# Patient Record
Sex: Male | Born: 1955 | Race: White | Hispanic: No | Smoking: Never smoker
Health system: Southern US, Community
[De-identification: ages and names within clinical notes are randomized; demographics above are authoritative.]

## PROBLEM LIST (undated history)

## (undated) DIAGNOSIS — I10 Essential (primary) hypertension: Secondary | ICD-10-CM

## (undated) DIAGNOSIS — F419 Anxiety disorder, unspecified: Secondary | ICD-10-CM

## (undated) DIAGNOSIS — G4700 Insomnia, unspecified: Secondary | ICD-10-CM

## (undated) DIAGNOSIS — N529 Male erectile dysfunction, unspecified: Secondary | ICD-10-CM

## (undated) DIAGNOSIS — E785 Hyperlipidemia, unspecified: Secondary | ICD-10-CM

## (undated) HISTORY — PX: OTHER SURGICAL HISTORY: SHX169

## (undated) HISTORY — PX: SHOULDER SURGERY: SHX246

---

## 2015-01-25 ENCOUNTER — Emergency Department
Admission: EM | Admit: 2015-01-25 | Discharge: 2015-01-25 | Disposition: A | Discharge status: Home / Self Care | Payer: PRIVATE HEALTH INSURANCE | Source: Home / Self Care | Attending: Family Medicine | Admitting: Family Medicine

## 2015-01-25 ENCOUNTER — Encounter: Payer: Self-pay | Admitting: Emergency Medicine

## 2015-01-25 DIAGNOSIS — R509 Fever, unspecified: Secondary | ICD-10-CM | POA: Diagnosis not present

## 2015-01-25 DIAGNOSIS — R519 Headache, unspecified: Secondary | ICD-10-CM

## 2015-01-25 DIAGNOSIS — R51 Headache: Secondary | ICD-10-CM | POA: Diagnosis not present

## 2015-01-25 HISTORY — DX: Hyperlipidemia, unspecified: E78.5

## 2015-01-25 HISTORY — DX: Essential (primary) hypertension: I10

## 2015-01-25 HISTORY — DX: Insomnia, unspecified: G47.00

## 2015-01-25 HISTORY — DX: Male erectile dysfunction, unspecified: N52.9

## 2015-01-25 HISTORY — DX: Anxiety disorder, unspecified: F41.9

## 2015-01-25 LAB — POCT CBC W AUTO DIFF (K'VILLE URGENT CARE)

## 2015-01-25 LAB — POCT URINALYSIS DIP (MANUAL ENTRY)
Bilirubin, UA: NEGATIVE
Glucose, UA: NEGATIVE
Leukocytes, UA: NEGATIVE
Nitrite, UA: NEGATIVE
SPEC GRAV UA: 1.025 (ref 1.005–1.03)
UROBILINOGEN UA: 0.2 (ref 0–1)
pH, UA: 6 (ref 5–8)

## 2015-01-25 LAB — POCT INFLUENZA A/B
INFLUENZA A, POC: NEGATIVE
Influenza B, POC: NEGATIVE

## 2015-01-25 MED ORDER — BUTALBITAL-APAP-CAFFEINE 50-325-40 MG PO TABS: 1.0000 | ORAL_TABLET | ORAL | Status: DC | PRN

## 2015-01-25 NOTE — Discharge Instructions (Signed)
Rest.  Increase fluid intake.  Check temperature daily. ° °If symptoms become significantly worse during the night or over the weekend, proceed to the local emergency room.  °

## 2015-01-25 NOTE — ED Notes (Signed)
Reports onset of fever and aches last night; headache severe; temp this a.m. 102.9; took ibuprofen twice this morning.

## 2015-01-25 NOTE — ED Provider Notes (Signed)
CSN: [id number]     Arrival date & time 01/25/15  1053 History   First MD Initiated Contact with Patient 01/25/15 1113     Chief Complaint  Patient presents with  . Fever  . Generalized Body Aches      HPI Comments: Last night patient developed fatigue, headache, myalgias, chills, and fever.  This morning he had a fever of 102.9.  No respiratory, GI, or GU symptoms. He states that two months ago he developed a pressure sensation in his anterior chest and was evaluated at [facility] ER.  He states that his evaluation was negative except for an abnormal EKG described as having "irregularly irregular rhythm."  He denies chest pain or tightness at present.  The history is provided by the patient.    Past Medical History  Diagnosis Date  . Hypertension   . Hyperlipidemia anxiety  . Anxiety   . ED (erectile dysfunction)   . Insomnia    Past Surgical History  Procedure Laterality Date  . Shoulder surgery    . Renal calculi     History reviewed. No pertinent family history. History  Substance Use Topics  . Smoking status: Current Every Day Smoker  . Smokeless tobacco: Not on file  . Alcohol Use: No    Review of Systems No sore throat No cough No pleuritic pain No wheezing No nasal congestion No post-nasal drainage No sinus pain/pressure No itchy/red eyes No earache No hemoptysis No SOB + fever, + chills No nausea No vomiting No abdominal pain No diarrhea No urinary symptoms No skin rash + fatigue + myalgias + headache Used OTC meds without relief  Allergies  Penicillins  Home Medications   Prior to Admission medications   Medication Sig Start Date End Date Taking? Authorizing Provider  ALPRAZolam Prudy Feeler(XANAX) 0.25 MG tablet Take 0.25 mg by mouth at bedtime as needed for anxiety.   Yes Historical Provider, MD  amLODipine (NORVASC) 5 MG tablet Take 5 mg by mouth daily.   Yes Historical Provider, MD  atorvastatin (LIPITOR) 80 MG tablet Take 80 mg by mouth  daily.   Yes Historical Provider, MD  lamoTRIgine (LAMICTAL) 100 MG tablet Take 100 mg by mouth daily.   Yes Historical Provider, MD  lisinopril (PRINIVIL,ZESTRIL) 10 MG tablet Take 10 mg by mouth daily.   Yes Historical Provider, MD  OLANZapine (ZYPREXA) 5 MG tablet Take 5 mg by mouth at bedtime.   Yes Historical Provider, MD  sildenafil (VIAGRA) 100 MG tablet Take 100 mg by mouth daily as needed for erectile dysfunction.   Yes Historical Provider, MD  tapentadol (NUCYNTA) 50 MG TABS tablet Take 75 mg by mouth.   Yes Historical Provider, MD  zolpidem (AMBIEN) 10 MG tablet Take 10 mg by mouth at bedtime as needed for sleep.   Yes Historical Provider, MD  butalbital-acetaminophen-caffeine (FIORICET, ESGIC) 50-325-40 MG per tablet Take 1 tablet by mouth every 4 (four) hours as needed for headache. (Max of 6 tabs/day) 01/25/15   Lattie HawStephen A Asako Saliba, MD   BP 118/66 mmHg  Pulse 105  Temp(Src) 101.6 F (38.7 C) (Oral)  Resp 18  Ht 5\' 8"  (1.727 m)  Wt 174 lb (78.926 kg)  BMI 26.46 kg/m2  SpO2 97% Physical Exam Nursing notes and Vital Signs reviewed. Appearance:  Patient appears stated age, and in no acute distress Eyes:  Pupils are equal, round, and reactive to light and accomodation.  Extraocular movement is intact.  Conjunctivae are not inflamed  Ears:  Canals  normal.  Tympanic membranes normal.  Nose:  Normal turbinates.  No sinus tenderness.   Pharynx:  Normal Neck:  Supple.  Tender enlarged posterior nodes are palpated bilaterally  Lungs:  Clear to auscultation.  Breath sounds are equal.  Heart:   Irregularly irregular rhythm without murmurs, rubs, or gallops.  Pulse 84 Abdomen:  Nontender without masses or hepatosplenomegaly.  Bowel sounds are present.  No CVA or flank tenderness.  Extremities:  No edema.  No calf tenderness Skin:  No rash present.   ED Course  Procedures  None   Labs Reviewed  POCT CBC W AUTO DIFF ([facility]):  WBC 8.9; LY 11.9; MO 3.4; GR 84.7; Hgb 14.2;  Platelets 205   POCT URINALYSIS DIP (MANUAL ENTRY):  KET trace; BLO small; PRO /dL; otherwise negative  POCT INFLUENZA A/B negative      MDM   1. Fever and chills ?etiology   2. Headache, unspecified headache type    Rx for Fioricet for headache Rest.  Increase fluid intake.  Check temperature daily. If symptoms become significantly worse during the night or over the weekend, proceed to the local emergency room.  Followup with Family Doctor if not improved in about 4 days. Note irregularly irregular heart rhythm on exam; recommend follow-up with PCP.    Lattie Haw, MD 01/28/15 [phone]

## 2017-04-30 ENCOUNTER — Emergency Department
Admission: EM | Admit: 2017-04-30 | Discharge: 2017-04-30 | Disposition: A | Discharge status: Home / Self Care | Payer: Federal, State, Local not specified - PPO | Source: Home / Self Care | Attending: Family Medicine | Admitting: Family Medicine

## 2017-04-30 ENCOUNTER — Encounter: Payer: Self-pay | Admitting: Emergency Medicine

## 2017-04-30 ENCOUNTER — Emergency Department (INDEPENDENT_AMBULATORY_CARE_PROVIDER_SITE_OTHER): Payer: Federal, State, Local not specified - PPO

## 2017-04-30 DIAGNOSIS — M79674 Pain in right toe(s): Secondary | ICD-10-CM | POA: Diagnosis not present

## 2017-04-30 DIAGNOSIS — S92504A Nondisplaced unspecified fracture of right lesser toe(s), initial encounter for closed fracture: Secondary | ICD-10-CM

## 2017-04-30 DIAGNOSIS — S90121A Contusion of right lesser toe(s) without damage to nail, initial encounter: Secondary | ICD-10-CM | POA: Diagnosis not present

## 2017-04-30 MED ORDER — LIDOCAINE 5 % EX PTCH: MEDICATED_PATCH | CUTANEOUS | 0 refills | Status: AC

## 2017-04-30 NOTE — ED Triage Notes (Signed)
Patient states that he dropped a trash can full of dirt on his right 3 toes last night.  He has neuropathy in his feet, able to move his toes, took a Nucynta at 9am this morning.

## 2017-04-30 NOTE — Discharge Instructions (Signed)
Wear comfortable well-fitting shoes. 

## 2017-04-30 NOTE — ED Provider Notes (Signed)
[facility]    CSN: [id number] Arrival date & time: 04/30/17  0929     History   Chief Complaint Chief Complaint  Patient presents with  . Toe Pain    HPI Jared Moore is a 61 y.o. male.   Patient reports that he dropped a trash can full of dirt on the distal aspect of his right foot yesterday.  He has peripheral neuropathy but notes mild pain with walking, and a "shooting" pain in his right third toe at night that awakens him.  He presently takes Nucynta for pain   The history is provided by the patient.  Toe Pain  This is a new problem. The current episode started yesterday. The problem has not changed since onset.The symptoms are aggravated by walking. Nothing relieves the symptoms. He has tried nothing for the symptoms.    Past Medical History:  Diagnosis Date  . Anxiety   . ED (erectile dysfunction)   . Hyperlipidemia anxiety  . Hypertension   . Insomnia     There are no active problems to display for this patient.   Past Surgical History:  Procedure Laterality Date  . renal calculi    . SHOULDER SURGERY         Home Medications    Prior to Admission medications   Medication Sig Start Date End Date Taking? Authorizing Provider  aspirin 81 MG chewable tablet Chew by mouth daily.   Yes [provider]  metoprolol succinate (TOPROL-XL) 50 MG 24 hr tablet Take 50 mg by mouth daily. Take with or immediately following a meal.   Yes [provider]  ALPRAZolam (XANAX) 0.25 MG tablet Take 0.25 mg by mouth at bedtime as needed for anxiety.    [provider]  amLODipine (NORVASC) 5 MG tablet Take 5 mg by mouth daily.    [provider]  atorvastatin (LIPITOR) 80 MG tablet Take 80 mg by mouth daily.    [provider]  lamoTRIgine (LAMICTAL) 100 MG tablet Take 100 mg by mouth daily.    [provider]  lidocaine (LIDODERM) 5 % Place partial patch (cut to fit) on affected toe and top of foot at  bedtime.  Remove & Discard patch within 12 hours 04/30/17 05/31/17  Lattie Haw, MD  OLANZapine (ZYPREXA) 5 MG tablet Take 5 mg by mouth at bedtime.    [provider]  sildenafil (VIAGRA) 100 MG tablet Take 100 mg by mouth daily as needed for erectile dysfunction.    [provider]  tapentadol (NUCYNTA) 50 MG TABS tablet Take 75 mg by mouth.    [provider]  zolpidem (AMBIEN) 10 MG tablet Take 10 mg by mouth at bedtime as needed for sleep.    [provider]    Family History No family history on file.  Social History Social History  Substance Use Topics  . Smoking status: Current Every Day Smoker  . Smokeless tobacco: Never Used  . Alcohol use No     Allergies   Penicillins   Review of Systems Review of Systems  All other systems reviewed and are negative.    Physical Exam Triage Vital Signs ED Triage Vitals  Enc Vitals Group     BP 04/30/17 0941 106/68     Pulse Rate 04/30/17 0941 61     Resp --      Temp 04/30/17 0941 98.2 F (36.8 C)     Temp Source 04/30/17 0941 Oral  SpO2 04/30/17 0941 94 %     Weight 04/30/17 0942 190 lb (86.2 kg)     Height 04/30/17 0942 5' 7.5" (1.715 m)     Head Circumference --      Peak Flow --      Pain Score 04/30/17 0942 8     Pain Loc --      Pain Edu? --      Excl. in [place]? --    No data found.   Updated Vital Signs BP 106/68 (BP Location: Left Arm)   Pulse 61   Temp 98.2 F (36.8 C) (Oral)   Ht 5' 7.5" (1.715 m)   Wt 190 lb (86.2 kg)   SpO2 94%   BMI 29.32 kg/m   Visual Acuity Right Eye Distance:   Left Eye Distance:   Bilateral Distance:    Right Eye Near:   Left Eye Near:    Bilateral Near:     Physical Exam  Constitutional: He appears well-developed and well-nourished. No distress.  HENT:  Head: Atraumatic.  Eyes: Pupils are equal, round, and reactive to light.  Cardiovascular: Normal rate.   Pulmonary/Chest: Effort normal.  Musculoskeletal: He exhibits no  edema.       Right foot: There is tenderness and bony tenderness. There is normal range of motion, no swelling, no crepitus and no laceration.       Feet:  Right foot reveals no swelling, ecchymosis, or hematoma.  Toes have good range of motion.  There is tenderness to palpation over the third MTP joint and the proximal phalanx.  Distal neurovascular function is intact.    Neurological: He is alert.  Skin: Skin is warm and dry.  Nursing note and vitals reviewed.    UC Treatments / Results  Labs (all labs ordered are listed, but only abnormal results are displayed) Labs Reviewed - No data to display  EKG  EKG Interpretation None       Radiology Dg Foot Complete Right  Result Date: 04/30/2017 CLINICAL DATA:  Pain in second and third toes after trauma. EXAM: RIGHT FOOT COMPLETE - 3+ VIEW COMPARISON:  None. FINDINGS: There is a subtle lucency associated with the proximal aspect of the second middle phalanx. This may represent a prominent nutrient foramen but a very subtle fracture is not excluded given history. No other evidence of fracture identified. IMPRESSION: A subtle lucency associated with the proximal aspect of the second middle phalanx may represent a prominent nutrient foramen but a very subtle nondisplaced fracture is not excluded given history. Electronically Signed   By: Gerome Sam III M.D   On: 04/30/2017 10:39    Procedures Procedures (including critical care time)  Medications Ordered in UC Medications - No data to display   Initial Impression / Assessment and Plan / UC Course  I have reviewed the triage vital signs and the nursing notes.  Pertinent labs & imaging results that were available during my care of the patient were reviewed by me and considered in my medical decision making (see chart for details).    Wear comfortable well fitting shoes. Rx for Lidoderm; apply at bedtime. Followup with Dr. Rodney Langton or Dr. Clementeen Graham ([facility]) if not improving about two weeks.     Final Clinical Impressions(s) / UC Diagnoses   Final diagnoses:  Contusion of third toe of right foot, initial encounter  Closed nondisplaced fracture of phalanx of lesser toe of right foot, unspecified phalanx, initial encounter  New Prescriptions New Prescriptions   LIDOCAINE (LIDODERM) 5 %    Place partial patch (cut to fit) on affected toe and top of foot at bedtime.  Remove & Discard patch within 12 hours         Lattie Haw, MD 05/08/17 [phone]

## 2017-05-02 ENCOUNTER — Telehealth: Payer: Self-pay

## 2017-05-02 NOTE — Telephone Encounter (Signed)
Left VM to return call 

## 2017-05-02 NOTE — Telephone Encounter (Signed)
Patient returned call. He reports his toes are starting to imoprove.

## 2018-07-30 IMAGING — DX DG FOOT COMPLETE 3+V*R*
3 series · 3 of 3 positions shown · non-contrast
Comparison: None.

CLINICAL DATA: Pain in second and third toes after trauma.

EXAM:
RIGHT FOOT COMPLETE - 3+ VIEW

[foot ap]
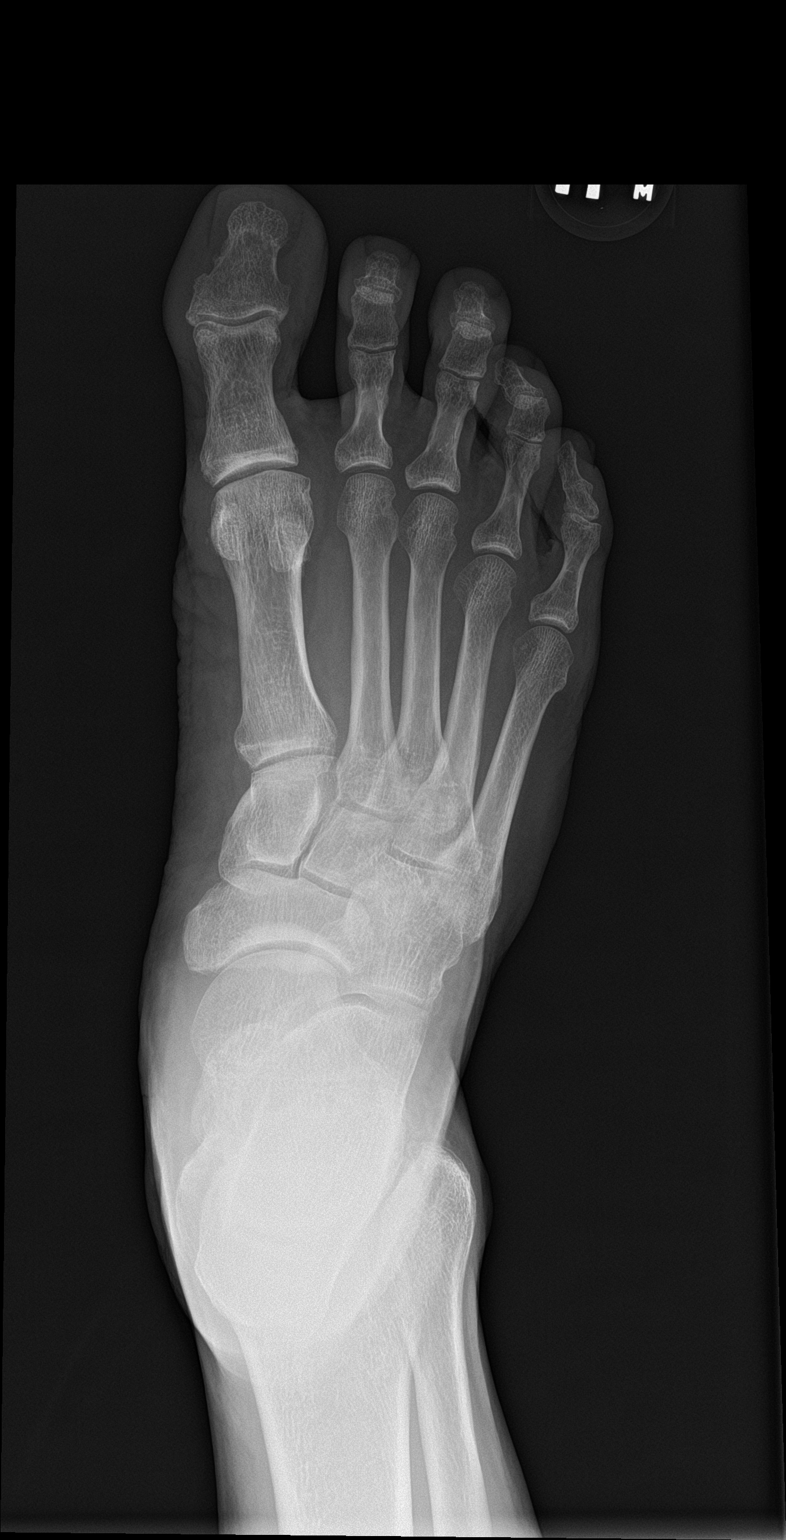

[foot obl]
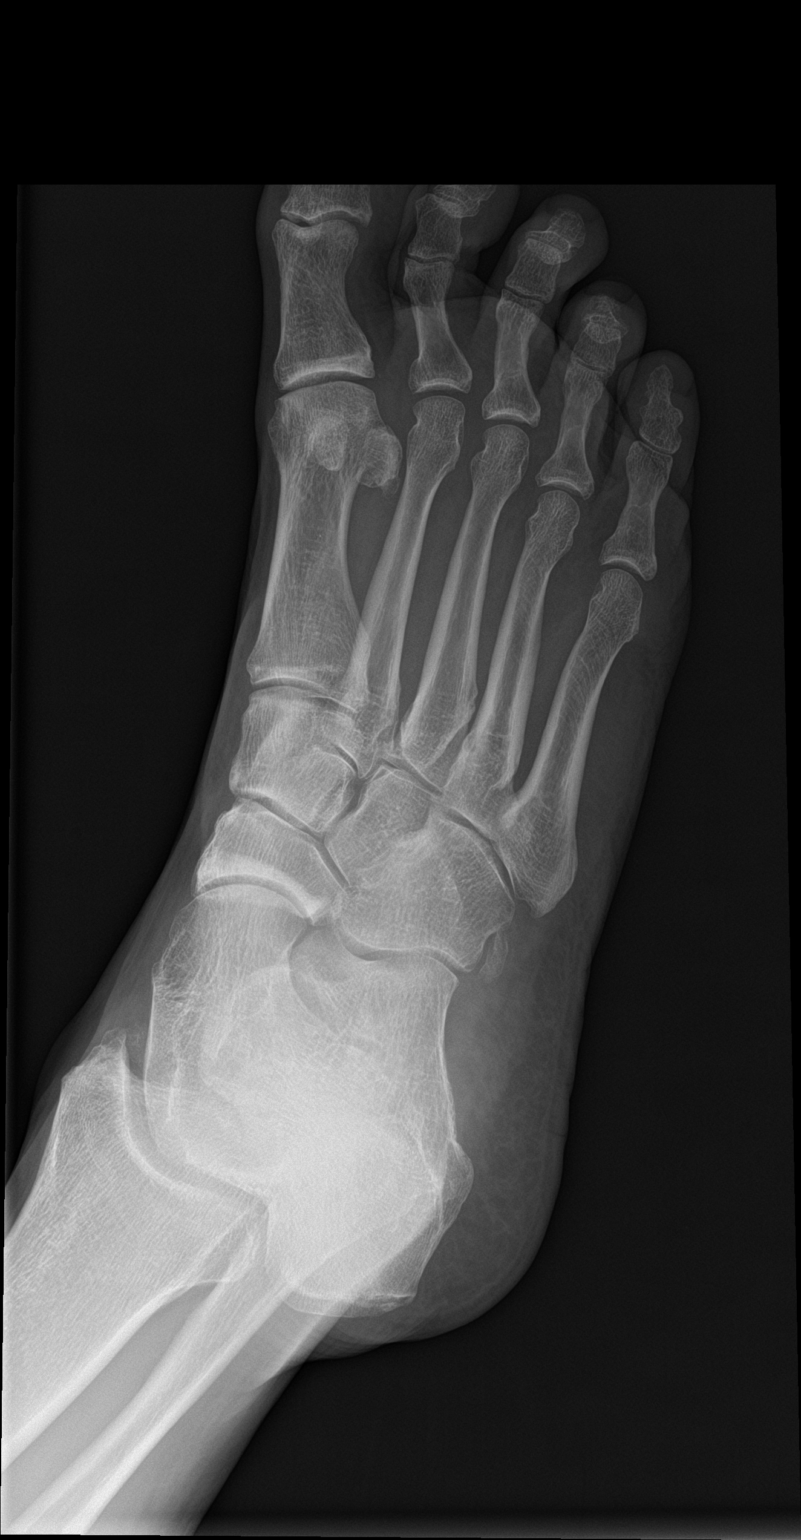

[foot lat]
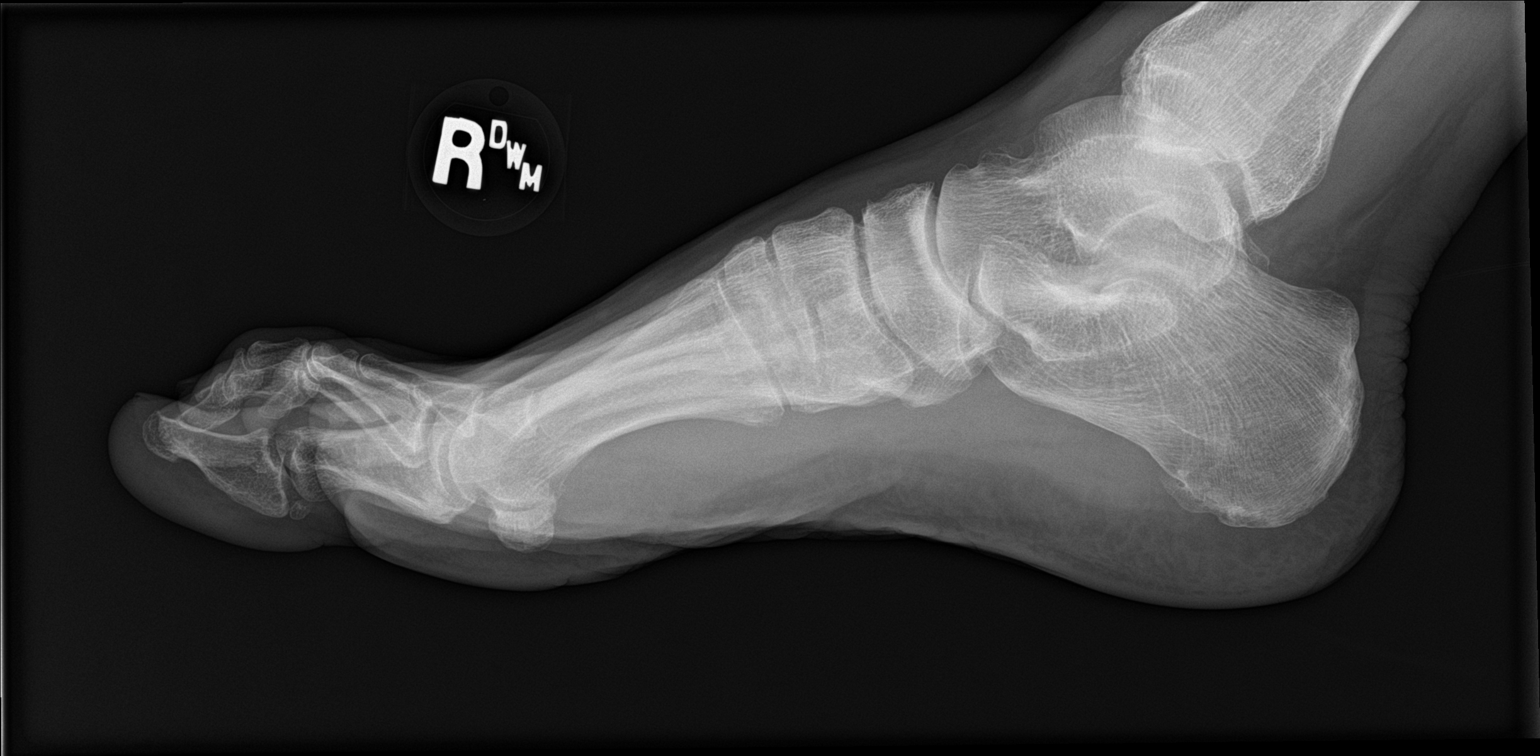

[3 of 3 positions shown; findings below may reference images not displayed]

FINDINGS: There is a subtle lucency associated with the proximal aspect of the
second middle phalanx. This may represent a prominent nutrient
foramen but a very subtle fracture is not excluded given history. No
other evidence of fracture identified.
IMPRESSION: A subtle lucency associated with the proximal aspect of the second
middle phalanx may represent a prominent nutrient foramen but a very
subtle nondisplaced fracture is not excluded given history.

## 2022-11-23 ENCOUNTER — Encounter: Payer: Self-pay | Admitting: Emergency Medicine

## 2022-11-23 ENCOUNTER — Ambulatory Visit
Admission: EM | Admit: 2022-11-23 | Discharge: 2022-11-23 | Disposition: A | Discharge status: Home / Self Care | Payer: Federal, State, Local not specified - PPO

## 2022-11-23 DIAGNOSIS — S61011A Laceration without foreign body of right thumb without damage to nail, initial encounter: Secondary | ICD-10-CM | POA: Diagnosis not present

## 2022-11-23 DIAGNOSIS — Z23 Encounter for immunization: Secondary | ICD-10-CM | POA: Diagnosis not present

## 2022-11-23 MED ORDER — TETANUS-DIPHTH-ACELL PERTUSSIS 5-2.5-18.5 LF-MCG/0.5 IM SUSY
0.5000 mL | PREFILLED_SYRINGE | Freq: Once | INTRAMUSCULAR | Status: AC
  Administered 2022-11-23: 0.5 mL via INTRAMUSCULAR

## 2022-11-23 MED ORDER — DOXYCYCLINE HYCLATE 100 MG PO CAPS: 100.0000 mg | ORAL_CAPSULE | Freq: Two times a day (BID) | ORAL | 0 refills | Status: AC

## 2022-11-23 MED ORDER — ACETAMINOPHEN 325 MG PO TABS
650.0000 mg | ORAL_TABLET | Freq: Once | ORAL | Status: AC
  Administered 2022-11-23: 650 mg via ORAL

## 2022-11-23 NOTE — ED Provider Notes (Signed)
[facility]    CSN: [id number] Arrival date & time: 11/23/22  1811      History   Chief Complaint Chief Complaint  Patient presents with   Laceration    Right thumb    HPI Jared Moore is a 67 y.o. male.   HPI Pleasant 67 year old male presents with right thumb laceration that occurred via utility knife at home this evening at 545.  Pain is throbbing patient reports did not clean it at home wrapped up in a washcloth.  Reports last Tdap was 2017.  Patient is currently on blood thinners.  Patient is accompanied by his wife who is currently awaiting patient.  PMH significant for hypertension, T2DM, paroxysmal atrial fibrillation, and hereditary coagulation factor deficiency.  Patient is a currently on apixaban 5 mg daily.  Past Medical History:  Diagnosis Date   Anxiety    ED (erectile dysfunction)    Hyperlipidemia anxiety   Hypertension    Insomnia     There are no problems to display for this patient.   Past Surgical History:  Procedure Laterality Date   renal calculi     SHOULDER SURGERY         Home Medications    Prior to Admission medications   Medication Sig Start Date End Date Taking? Authorizing Provider  doxycycline (VIBRAMYCIN) 100 MG capsule Take 1 capsule (100 mg total) by mouth 2 (two) times daily for 7 days. 11/23/22 11/30/22 Yes Eliezer Lofts, FNP  ELIQUIS 5 MG TABS tablet Take 1 tablet by mouth 2 (two) times daily. 03/12/21  Yes [provider]  hydrOXYzine (ATARAX) 50 MG tablet TAKE 1 TABLET BY MOUTH 3 TIMES A DAY BY AS NEEDED FOR ANXIETY/SLEEP 03/30/21  Yes [provider]  LINZESS 145 MCG CAPS capsule Take 1 tablet by mouth daily. 04/19/22  Yes [provider]  metoprolol tartrate (LOPRESSOR) 25 MG tablet Take by mouth. 03/12/21  Yes [provider]  traZODone (DESYREL) 50 MG tablet Take 1-2 tablets by mouth at bedtime as needed. 07/12/19  Yes [provider]  ALPRAZolam Duanne Moron) 0.25 MG tablet Take  0.25 mg by mouth at bedtime as needed for anxiety.    [provider]  amLODipine (NORVASC) 5 MG tablet Take 5 mg by mouth daily.    [provider]  aspirin 81 MG chewable tablet Chew by mouth daily.    [provider]  atorvastatin (LIPITOR) 80 MG tablet Take 80 mg by mouth daily.    [provider]  B Complex Vitamins (VITAMIN B COMPLEX) TABS Take 1 tablet by mouth daily.    [provider]  lamoTRIgine (LAMICTAL) 100 MG tablet Take 100 mg by mouth daily.    [provider]  lisinopril (ZESTRIL) 10 MG tablet Take 1 tablet by mouth daily.    [provider]  metoprolol succinate (TOPROL-XL) 50 MG 24 hr tablet Take 50 mg by mouth daily. Take with or immediately following a meal.    [provider]  OLANZapine (ZYPREXA) 5 MG tablet Take 5 mg by mouth at bedtime.    [provider]  sildenafil (VIAGRA) 100 MG tablet Take 100 mg by mouth daily as needed for erectile dysfunction.    [provider]  tapentadol (NUCYNTA) 50 MG TABS tablet Take 75 mg by mouth.    [provider]  zolpidem (AMBIEN) 10 MG tablet Take 10 mg by mouth at bedtime as needed for sleep.    [provider]  Family History Family History  Problem Relation Age of Onset   Alzheimer's disease Mother    CAD Father    Kidney disease Father     Social History Social History   Tobacco Use   Smoking status: Never   Smokeless tobacco: Never  Vaping Use   Vaping Use: Never used  Substance Use Topics   Alcohol use: No   Drug use: Never     Allergies   Ace inhibitors and Penicillins   Review of Systems Review of Systems  Skin:  Positive for wound.     Physical Exam Triage Vital Signs ED Triage Vitals  Enc Vitals Group     BP 11/23/22 1827 (!) 149/83     Pulse Rate 11/23/22 1827 98     Resp 11/23/22 1827 16     Temp 11/23/22 1827 98.9 F (37.2 C)     Temp Source 11/23/22 1827 Oral     SpO2 11/23/22  1827 98 %     Weight 11/23/22 1829 179 lb 8 oz (81.4 kg)     Height --      Head Circumference --      Peak Flow --      Pain Score 11/23/22 1828 5     Pain Loc --      Pain Edu? --      Excl. in [place]? --    No data found.  Updated Vital Signs BP (!) 149/83 (BP Location: Left Arm)   Pulse 98   Temp 98.9 F (37.2 C) (Oral)   Resp 16   Wt 179 lb 8 oz (81.4 kg)   SpO2 98%   BMI 27.70 kg/m   Physical Exam Vitals and nursing note reviewed.  Constitutional:      Appearance: Normal appearance. He is obese.  HENT:     Head: Normocephalic and atraumatic.     Mouth/Throat:     Mouth: Mucous membranes are moist.     Pharynx: Oropharynx is clear.  Eyes:     Extraocular Movements: Extraocular movements intact.     Conjunctiva/sclera: Conjunctivae normal.     Pupils: Pupils are equal, round, and reactive to light.  Cardiovascular:     Rate and Rhythm: Normal rate and regular rhythm.     Pulses: Normal pulses.     Heart sounds: Normal heart sounds. No murmur heard. Pulmonary:     Effort: Pulmonary effort is normal.     Breath sounds: Normal breath sounds. No wheezing, rhonchi or rales.  Musculoskeletal:        General: Normal range of motion.     Cervical back: Normal range of motion and neck supple.  Skin:    General: Skin is warm and dry.     Comments: Right thumb (distal phalanx/medial aspect adjacent to nail plate):V-shaped flap laceration~1.0 cm   Neurological:     General: No focal deficit present.     Mental Status: He is alert and oriented to person, place, and time. Mental status is at baseline.      UC Treatments / Results  Labs (all labs ordered are listed, but only abnormal results are displayed) Labs Reviewed - No data to display  EKG   Radiology No results found.  Procedures Laceration Repair  Date/Time: 11/23/2022 8:09 PM  Performed by: Eliezer Lofts, FNP Authorized by: Eliezer Lofts, FNP   Consent:    Consent obtained:  Verbal   Consent  given by:  Patient   Risks discussed:  Infection, need  for additional repair, pain, poor cosmetic result and poor wound healing   Alternatives discussed:  No treatment and delayed treatment Universal protocol:    Procedure explained and questions answered to patient or proxy's satisfaction: yes     Relevant documents present and verified: yes     Test results available: yes     Imaging studies available: yes     Required blood products, implants, devices, and special equipment available: yes     Site/side marked: yes     Immediately prior to procedure, a time out was called: yes     Patient identity confirmed:  Verbally with patient Anesthesia:    Anesthesia method:  Local infiltration   Local anesthetic:  Lidocaine 2% w/o epi Laceration details:    Location: Right thumb.   Length (cm):  2   Depth (mm):  2 Pre-procedure details:    Preparation:  Patient was prepped and draped in usual sterile fashion Exploration:    Limited defect created (wound extended): no     Wound exploration: wound explored through full range of motion     Contaminated: no   Treatment:    Area cleansed with:  Povidone-iodine and soap and water   Amount of cleaning:  Standard   Visualized foreign bodies/material removed: no     Debridement:  None   Undermining:  None   Scar revision: no   Skin repair:    Repair method:  Sutures   Suture size:  4-0   Number of sutures:  4 Approximation:    Approximation:  Close Repair type:    Repair type:  Simple Post-procedure details:    Dressing:  Antibiotic ointment, non-adherent dressing, sterile dressing and adhesive bandage   Procedure completion:  Tolerated well, no immediate complications  (including critical care time)  Medications Ordered in UC Medications  Tdap (BOOSTRIX) injection 0.5 mL (0.5 mLs Intramuscular Given 11/23/22 1852)  acetaminophen (TYLENOL) tablet 650 mg (650 mg Oral Given 11/23/22 1857)    Initial Impression / Assessment and Plan / UC  Course  I have reviewed the triage vital signs and the nursing notes.  Pertinent labs & imaging results that were available during my care of the patient were reviewed by me and considered in my medical decision making (see chart for details).     MDM: 1.  Laceration of right thumb without foreign body without damage to nail, initial encounter-please see lack repair note patient tolerated procedure well Rx'd Doxycycline 100 mg twice daily for 7 days empirically for wound infection prevention.  Tdap given prior to discharge.  Specific wound instructions given to patient along with supplies prior to discharge. Instructed patient to take medication as directed with food to completion.  Encouraged patient to increase daily water intake to 64 ounces per day while taking this medication.  Advised patient to keep wound area dry and clean for the next 36 hours.  Advised patient may get hand wet but do not submerge hand underwater for any time.  Advised after 48 hours to leave wound open, dry, clean as possible to allow area to form scab and heal by secondary intention.  Advised if symptoms worsen and/or unresolved please follow-up with PCP or here for further evaluation.  Advised sutures may be removed in the next 7 to 10 days. Final Clinical Impressions(s) / UC Diagnoses   Final diagnoses:  Laceration of right thumb without foreign body without damage to nail, initial encounter     Discharge Instructions  Instructed patient to take medication as directed with food to completion.  Encouraged patient to increase daily water intake to 64 ounces per day while taking this medication.  Advised patient to keep wound area dry and clean for the next 36 hours.  Advised patient may get hand wet but do not submerge hand underwater for any time.  Advised after 48 hours to leave wound open, dry, clean as possible to allow area to form scab and heal by secondary intention.  Advised if symptoms worsen and/or  unresolved please follow-up with PCP or here for further evaluation.  Advised sutures may be removed in the next 7 to 10 days.     ED Prescriptions     Medication Sig Dispense Auth. Provider   doxycycline (VIBRAMYCIN) 100 MG capsule Take 1 capsule (100 mg total) by mouth 2 (two) times daily for 7 days. 14 capsule Eliezer Lofts, FNP      PDMP not reviewed this encounter.   Eliezer Lofts, [place] 11/23/22 2011

## 2022-11-23 NOTE — Discharge Instructions (Addendum)
Instructed patient to take medication as directed with food to completion.  Encouraged patient to increase daily water intake to 64 ounces per day while taking this medication.  Advised patient to keep wound area dry and clean for the next 36 hours.  Advised patient may get hand wet but do not submerge hand underwater for any time.  Advised after 48 hours to leave wound open, dry, clean as possible to allow area to form scab and heal by secondary intention.  Advised if symptoms worsen and/or unresolved please follow-up with PCP or here for further evaluation.  Advised sutures may be removed in the next 7 to 10 days.

## 2022-11-23 NOTE — ED Triage Notes (Signed)
Pt cut his  R thumb on a utility knife at home at 1745 Pain is throbbing - no meds at home Did not clean at home  Wrapped up in a wash cloth Last Tdap 2017 Pt on blood thinners PT's wife in waiting area
